# Patient Record
Sex: Male | Born: 1970 | Race: White | Hispanic: No | Marital: Married | Smoking: Former smoker
Health system: Southern US, Community
[De-identification: ages and names within clinical notes are randomized; demographics above are authoritative.]

## PROBLEM LIST (undated history)

## (undated) DIAGNOSIS — S46211A Strain of muscle, fascia and tendon of other parts of biceps, right arm, initial encounter: Secondary | ICD-10-CM

## (undated) DIAGNOSIS — Z973 Presence of spectacles and contact lenses: Secondary | ICD-10-CM

## (undated) HISTORY — PX: APPENDECTOMY: SHX54

---
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
## 2007-10-15 ENCOUNTER — Ambulatory Visit: Payer: Self-pay | Admitting: Gastroenterology

## 2008-10-24 ENCOUNTER — Ambulatory Visit: Payer: Self-pay | Admitting: Internal Medicine

## 2009-08-16 ENCOUNTER — Ambulatory Visit: Payer: Self-pay | Admitting: Internal Medicine

## 2010-03-21 ENCOUNTER — Ambulatory Visit: Payer: Self-pay | Admitting: Internal Medicine

## 2011-09-07 ENCOUNTER — Emergency Department: Payer: Self-pay | Admitting: Emergency Medicine

## 2011-09-08 LAB — URINALYSIS, COMPLETE
Bilirubin,UR: NEGATIVE
Blood: NEGATIVE
Glucose,UR: NEGATIVE mg/dL (ref 0–75)
Ketone: NEGATIVE
Leukocyte Esterase: NEGATIVE
Nitrite: NEGATIVE
RBC,UR: NONE SEEN /HPF (ref 0–5)
Specific Gravity: 1.019 (ref 1.003–1.030)

## 2011-09-08 LAB — COMPREHENSIVE METABOLIC PANEL
Alkaline Phosphatase: 80 U/L (ref 50–136)
Anion Gap: 7 (ref 7–16)
BUN: 15 mg/dL (ref 7–18)
Bilirubin,Total: 0.6 mg/dL (ref 0.2–1.0)
Calcium, Total: 9.1 mg/dL (ref 8.5–10.1)
Chloride: 108 mmol/L — ABNORMAL HIGH (ref 98–107)
Co2: 26 mmol/L (ref 21–32)
EGFR (African American): >60
EGFR (Non-African Amer.): >60
Glucose: 92 mg/dL (ref 65–99)
Potassium: 3.9 mmol/L (ref 3.5–5.1)
Sodium: 141 mmol/L (ref 136–145)

## 2011-09-08 LAB — TSH: Thyroid Stimulating Horm: 2.6 u[IU]/mL

## 2011-09-08 LAB — CBC
HCT: 44.6 % (ref 40.0–52.0)
HGB: 15 g/dL (ref 13.0–18.0)
MCHC: 33.7 g/dL (ref 32.0–36.0)
MCV: 90 fL (ref 80–100)
Platelet: 224 10*3/uL (ref 150–440)
RDW: 13 % (ref 11.5–14.5)
WBC: 15.7 10*3/uL — ABNORMAL HIGH (ref 3.8–10.6)

## 2011-09-08 LAB — MAGNESIUM: Magnesium: 2.4 mg/dL

## 2011-09-08 LAB — CK TOTAL AND CKMB (NOT AT ARMC): CK, Total: 147 U/L (ref 35–232)

## 2011-09-08 LAB — TROPONIN I: Troponin-I: <0.02 ng/mL

## 2011-09-19 ENCOUNTER — Ambulatory Visit: Payer: Self-pay | Admitting: Cardiology

## 2013-08-23 ENCOUNTER — Emergency Department (HOSPITAL_COMMUNITY)
Admission: EM | Admit: 2013-08-23 | Discharge: 2013-08-23 | Disposition: A | Discharge status: Home / Self Care | Payer: BC Managed Care – PPO | Attending: Emergency Medicine | Admitting: Emergency Medicine

## 2013-08-23 ENCOUNTER — Encounter (HOSPITAL_COMMUNITY): Payer: Self-pay | Admitting: Emergency Medicine

## 2013-08-23 DIAGNOSIS — Z791 Long term (current) use of non-steroidal anti-inflammatories (NSAID): Secondary | ICD-10-CM | POA: Insufficient documentation

## 2013-08-23 DIAGNOSIS — Y9239 Other specified sports and athletic area as the place of occurrence of the external cause: Secondary | ICD-10-CM | POA: Insufficient documentation

## 2013-08-23 DIAGNOSIS — X500XXA Overexertion from strenuous movement or load, initial encounter: Secondary | ICD-10-CM | POA: Insufficient documentation

## 2013-08-23 DIAGNOSIS — S46909A Unspecified injury of unspecified muscle, fascia and tendon at shoulder and upper arm level, unspecified arm, initial encounter: Secondary | ICD-10-CM | POA: Insufficient documentation

## 2013-08-23 DIAGNOSIS — Y92838 Other recreation area as the place of occurrence of the external cause: Secondary | ICD-10-CM

## 2013-08-23 DIAGNOSIS — S4980XA Other specified injuries of shoulder and upper arm, unspecified arm, initial encounter: Secondary | ICD-10-CM | POA: Insufficient documentation

## 2013-08-23 DIAGNOSIS — Y9364 Activity, baseball: Secondary | ICD-10-CM | POA: Insufficient documentation

## 2013-08-23 DIAGNOSIS — M67921 Unspecified disorder of synovium and tendon, right upper arm: Secondary | ICD-10-CM

## 2013-08-23 MED ORDER — OXYCODONE-ACETAMINOPHEN 5-325 MG PO TABS: 1.0000 | ORAL_TABLET | Freq: Four times a day (QID) | ORAL | Status: DC | PRN

## 2013-08-23 MED ORDER — IBUPROFEN 800 MG PO TABS: 800.0000 mg | ORAL_TABLET | Freq: Three times a day (TID) | ORAL | Status: DC

## 2013-08-23 MED ORDER — OXYCODONE-ACETAMINOPHEN 5-325 MG PO TABS
2.0000 | ORAL_TABLET | Freq: Once | ORAL | Status: AC
  Administered 2013-08-23: 2 via ORAL
  Filled 2013-08-23: qty 2

## 2013-08-23 NOTE — ED Notes (Signed)
Dr Zavitz at bedside  

## 2013-08-23 NOTE — ED Notes (Signed)
Pt reports that today he was playing baseball, and was batting, was swinging through and felt his right bicep pop. Pain to right bicep, tightness felt. Can move arm, sensation intact. Pt is a x 4

## 2013-08-23 NOTE — ED Notes (Signed)
Paged Ortho Tech to apply sling immobilizer

## 2013-08-23 NOTE — Progress Notes (Signed)
Orthopedic Tech Progress Note Patient Details:  Joshua DrownStephen Brock 1970/05/21 [id number]  Ortho Devices Type of Ortho Device: Shoulder immobilizer Ortho Device/Splint Location: RUE Ortho Device/Splint Interventions: Ordered;Application   Joshua Brock, Joshua Brock 08/23/2013, 6:41 PM

## 2013-08-23 NOTE — Discharge Instructions (Signed)
Please follow up with Dr. Dion SaucierLandau to schedule a follow up appointment. Please take pain medication and/or muscle relaxants as prescribed and as needed for pain. Please do not drive on narcotic pain medication or on muscle relaxants. Please follow RICE method below. Please take motrin for mild to moderate pain and swelling. Please read all discharge instructions and return precautions.   Biceps Tendon Disruption (Distal) with Rehab The biceps tendon attaches the biceps muscle to the bones of the elbow and the shoulder. A distal biceps tendon disruption is a tear of this tendon at the end the attached near the elbow. A distal biceps tendon rupture is an uncommon injury. These injuries usually involve a complete tear of the tendon from the bone; however, partial tears are also possible. The bicep muscle works with other muscles to bend the elbow and rotate the palm upward (supinate). A complete biceps rupture will result in approximately a 30% decrease in elbow bending strength and a 40% decrease in one's ability to supinate the wrist. SYMPTOMS   Pain, tenderness, swelling, warmth, or redness at the elbow, usually in the front of the elbow.  Pain that worsens with flexion of the elbow against resistance and when straightening the elbow.  Bulge can be seen and felt in the arm.  Bruising (contusion) in the elbow or forearm after 24 hours.  Limited motion of the elbow.  Weakness with attempted elbow bending (lifting or carrying) or rotation of the wrist (like when using a screwdriver).  A crackling sound (crepitation) when the tendon or elbow is moved or touched. CAUSES  A biceps tendon rupture occurs when the tendon is subjected to a force that is greater than it can withstand, such as straightening the elbow while the biceps is contracted or direct trauma (rare). RISK INCREASES WITH:   Sports that involve contact, as well as throwing sports, gymnastics, weightlifting, and bodybuilding.  Heavy  labor.  Poor strength and flexibility.  Failure to warm-up properly before activity. PREVENTION  Warm up and stretch appropriately before activity.  Maintain physical fitness:  Strength, flexibility, and endurance.  Cardiovascular fitness  Allow your body to recover between practices and competition.  Learn and use proper technique. PROGNOSIS  Surgery is usually required to fix distal biceps tendon rupture. After surgery, a recovery period of 4 to 8 months can be expected to allow for healing and a return to sports.  RELATED COMPLICATIONS  Weakness of elbow bending and forearm rotation, especially if treated non-surgically.  Prolonged disability.  Re-rupture of the tendon after surgery.  Risks of surgery, including infection, bleeding, injury to nerves, elbow or wrist stiffness or loss of motion, and weakness of elbow bending or wrist rotation. TREATMENT  Treatment initially consists of ice and medication to help reduce pain and inflammation. A sling may also be worn to increase one's comfort. Surgery is required for a full recovery and return to sports. Surgery involves reattaching the tendon to the bone. Weakness can be expected if surgery is not performed; however, this may be acceptable for sedentary individuals. Surgery is usually followed by immobilization and rehabilitation exercises to regain strength and a full range of motion.  MEDICATION  If pain medication is necessary, nonsteroidal anti-inflammatory medications, such as aspirin and ibuprofen, or other minor pain relievers, such as acetaminophen, are often recommended.  Do not take pain medication for 7 days before surgery.  Prescription pain relievers may be given if deemed necessary by your caregiver. Use only as directed and only as much  as you need. HEAT AND COLD  Cold treatment (icing) relieves pain and reduces inflammation. Cold treatment should be applied for 10 to 15 minutes every 2 to 3 hours for  inflammation and pain and immediately after any activity that aggravates your symptoms. Use ice packs or an ice massage.  Heat treatment may be used prior to performing the stretching and strengthening activities prescribed by your caregiver, physical therapist, or athletic trainer. Use a heat pack or a warm soak. SEEK MEDICAL CARE IF:   Symptoms get worse or do not improve in 2 weeks despite treatment.  You experience pain, numbness, or coldness in the hand.  Blue, gray, or dark color appears in the fingernails.  Any of the following occur after surgery:  Increased pain, swelling, redness, drainage, or bleeding in the surgical area.  Signs of infection (headache, muscle aches, dizziness, or a general ill feeling with fever).  New, unexplained symptoms develop (drugs used in treatment may produce side effects).  RICE: Routine Care for Injuries The routine care of many injuries includes Rest, Ice, Compression, and Elevation (RICE). HOME CARE INSTRUCTIONS  Rest is needed to allow your body to heal. Routine activities can usually be resumed when comfortable. Injured tendons and bones can take up to 6 weeks to heal. Tendons are the cord-like structures that attach muscle to bone.  Ice following an injury helps keep the swelling down and reduces pain.  Put ice in a plastic bag.  Place a towel between your skin and the bag.  Leave the ice on for 15-20 minutes, 03-04 times a day. Do this while awake, for the first 24 to 48 hours. After that, continue as directed by your caregiver.  Compression helps keep swelling down. It also gives support and helps with discomfort. If an elastic bandage has been applied, it should be removed and reapplied every 3 to 4 hours. It should not be applied tightly, but firmly enough to keep swelling down. Watch fingers or toes for swelling, bluish discoloration, coldness, numbness, or excessive pain. If any of these problems occur, remove the bandage and reapply  loosely. Contact your caregiver if these problems continue.  Elevation helps reduce swelling and decreases pain. With extremities, such as the arms, hands, legs, and feet, the injured area should be placed near or above the level of the heart, if possible. SEEK IMMEDIATE MEDICAL CARE IF:  You have persistent pain and swelling.  You develop redness, numbness, or unexpected weakness.  Your symptoms are getting worse rather than improving after several days. These symptoms may indicate that further evaluation or further X-rays are needed. Sometimes, X-rays may not show a small broken bone (fracture) until 1 week or 10 days later. Make a follow-up appointment with your caregiver. Ask when your X-ray results will be ready. Make sure you get your X-ray results. Document Released: 06/11/2000 Document Revised: 05/22/2011 Document Reviewed: 07/29/2010 [facility] Patient Information 2014 [place], [place]. Sling Use After Injury or Surgery You have been put in a sling today because of an injury or following surgery. If you have a tendon or bone injury it may take up to 6 weeks to heal. Use the sling as directed until your caregiver says it is no longer needed. The sling protects and keeps you from using the injured part. Hanging your arm in a sling will give rest and support to the injured part. This also helps with comfort and healing. Slings are used for injuries made worse or more painful by movement. Examples include:  Broken arms.  Broken collarbones.  Shoulder injuries.  Following surgery. The sling should fit comfortably, with your elbow at one end of the sling and your hand at the other end. Your elbow is bent 90 degrees lying across your waist and rests in the sling with your thumb pointing up. Make sure that the hand of the injured arm does not droop down. That could stretch some nerves in the wrist. Your hand should be slightly higher than your elbow. You may also pad the sling behind your neck  with some cloth or foam rubber.  A swathe may also be used if it is necessary to keep you from lifting your injured arm. A swathe is a wrap or ace bandage that goes around your chest over your injured arm.  To take the weight off your neck, some slings have a strap that goes around your neck and down your back. One strap is connected to the closed elbow side of the sling with the other end of the strap attached to the wrist side. With a sling like this, your injured shoulder, arm, wrist, or hand is in the sling, the weight is more on your shoulder and back. This is different from the illustration where the sling is supported only by the neck.  In an emergency, a sling can be as simple as a belt or towel tied around your neck to hold your forearm.  HOME CARE INSTRUCTIONS   Do not use your shoulder until instructed to by your caregiver.  If you have been prescribed physical therapy, keep appointments as directed.  For the first couple days following your injury and during times when you are sore, you may use ice on the injured area for 15-20 minutes 03-04 times per day while awake. Put the ice in a plastic bag and place a towel between the bag of ice and your skin. This will help keep the swelling down.  If there is numbness in the fifth finger and ring fingers you may need to pad the elbow to relieve pressure on the ulnar nerve (the crazy bone).  Keep your arm on your chest when lying down.  If a plaster splint was applied, wear the splint until you are seen for a follow-up examination. Rest it on nothing harder than a pillow the first 24 hours. Do not get it wet. You may take it off to take a shower or bath unless instructed otherwise by your caregiver.  You may have been given an elastic bandage to use with the plaster splint or alone. The splint is too tight if you have numbness, tingling, or if your hand becomes cold and blue. Adjust or reapply the bandage to make it comfortable.  Only take  over-the-counter or prescription medicines for pain, discomfort, or fever as directed by your caregiver.  If range of motion exercises are permitted by your caregiver, do not go over the limits suggested. If you have increased pain from doing gentle exercises, stop the exercises until you see your caregiver again.  The length of time needed for healing depends on what your injury or surgery was. SEEK IMMEDIATE MEDICAL CARE IF:   You have an increase in bruising, swelling or pain in the area of your injury or surgery.  You notice a blue color of or coldness in your fingers.  Pain relief is not obtained with medications or any of your problems are getting worse. Document Released: 10/12/2003 Document Revised: 02/14/2012 Document Reviewed: 01/12/2007 ExitCare Patient Information  2014 [employer]. ° °

## 2013-08-23 NOTE — ED Provider Notes (Signed)
CSN: [id number]     Arrival date & time 08/23/13  1640 History   First MD Initiated Contact with Patient 08/23/13 1651     Chief Complaint  Patient presents with  . Arm Injury     (Consider location/radiation/quality/duration/timing/severity/associated sxs/prior Treatment) HPI Comments: Patient is a 43 yo M presenting to the ED for right arm injury. Patient states he was swinging a factor at this point in time he felt a loud popping noise and had pain in the distal bicep area. States it feels very tight, he endorses severe pain. He states his pain is worsened by extension of his elbow and rotation and elbow. Did not take any medications prior to arrival. He denies any other injuries. He denies any numbness or tingling. He is left-hand dominant.  Patient is a 43 y.o. male presenting with arm injury.  Arm Injury Associated symptoms: no fever     History reviewed. No pertinent past medical history. Past Surgical History  Procedure Laterality Date  . Appendectomy     No family history on file. History  Substance Use Topics  . Smoking status: Never Smoker   . Smokeless tobacco: Not on file  . Alcohol Use: No    Review of Systems  Constitutional: Negative for fever and chills.  Musculoskeletal: Positive for arthralgias and myalgias.  All other systems reviewed and are negative.     Allergies  Review of patient's allergies indicates no known allergies.  Home Medications   Prior to Admission medications   Medication Sig Start Date End Date Taking? Authorizing Provider  ibuprofen (ADVIL,MOTRIN) 800 MG tablet Take 1 tablet (800 mg total) by mouth 3 (three) times daily. 08/23/13   Margene Cherian L Radiah Lubinski, PA-C  oxyCODONE-acetaminophen (PERCOCET/ROXICET) 5-325 MG per tablet Take 1-2 tablets by mouth every 6 (six) hours as needed for severe pain. 08/23/13   Lovell Nuttall L Lastacia Solum, PA-C   BP 137/85  Pulse 100  Temp(Src) 99.4 F (37.4 C) (Oral)  Resp 23  Wt 240 lb (108.863 kg)   SpO2 96% Physical Exam  Nursing note and vitals reviewed. Constitutional: He is oriented to person, place, and time. He appears well-developed and well-nourished. He is uncooperative. No distress.  HENT:  Head: Normocephalic and atraumatic.  Right Ear: External ear normal.  Left Ear: External ear normal.  Nose: Nose normal.  Mouth/Throat: Oropharynx is clear and moist.  Eyes: Conjunctivae are normal.  Neck: Normal range of motion. Neck supple.  Cardiovascular: Normal rate, regular rhythm, normal heart sounds and intact distal pulses.   Pulmonary/Chest: Effort normal and breath sounds normal. No respiratory distress.  Abdominal: Soft.  Musculoskeletal:       Right shoulder: He exhibits decreased range of motion, tenderness and decreased strength. He exhibits no bony tenderness, no swelling, no effusion, no crepitus, no deformity, no laceration, no pain, no spasm and normal pulse.       Left shoulder: Normal.       Right elbow: He exhibits decreased range of motion and swelling. He exhibits no deformity and no laceration. Tenderness found.       Left elbow: Normal.       Right wrist: Normal.       Left wrist: Normal.       Arms:      Right hand: Normal.  Neurological: He is alert and oriented to person, place, and time.  Skin: Skin is warm and dry. He is not diaphoretic.  Psychiatric: He has a normal mood and affect.  ED Course  Procedures (including critical care time) Medications  oxyCODONE-acetaminophen (PERCOCET/ROXICET) 5-325 MG per tablet 2 tablet (2 tablets Oral Given 08/23/13 1719)    Labs Review Labs Reviewed - No data to display  Imaging Review No results found.   EKG Interpretation None      MDM   Final diagnoses:  Disorder of tendon of right biceps    Filed Vitals:   08/23/13 1730  BP: 137/85  Pulse: 100  Temp:   Resp: 23   Afebrile, NAD, non-toxic appearing, AAOx4.  Neurovascularly intact. Normal sensation. ROM is tact. Symptoms like related to  biceps tendon rupture. Will place arm in sling and have patient follow up with orthopedics. Return precautions discussed. Patient is agreeable to plan. Patient is stable at time of discharge Patient d/w with Dr. Jodi MourningZavitz, agrees with plan.        Jeannetta EllisJennifer L Cerria Randhawa, PA-C 08/24/13 1308

## 2013-08-25 ENCOUNTER — Other Ambulatory Visit: Payer: Self-pay | Admitting: Orthopedic Surgery

## 2013-08-25 NOTE — ED Provider Notes (Signed)
Medical screening examination/treatment/procedure(s) were conducted as a shared visit with non-physician practitioner(s) or resident and myself. I personally evaluated the patient during the encounter and agree with the findings and plan unless otherwise indicated.  I have personally reviewed any xrays and/ or EKG's with the provider and I agree with interpretation.  Patient presents with left arm swelling and pain after playing baseball and felt a sudden pop during his swing. No history of similar. Pain with flexion supination pronation. Mild swelling. On exam neurovascular intact, no bone tenderness in upper or lower aspect of right arm, mild bulge and right bicep asymmetric to left and tenderness in a.c. joint. Clinically bicep tear versus rupture versus other musculoskeletal strain. Discussed risks and benefits of x-ray and low yield and patient wishes to hold on x-ray and followup with local orthopedic that would recommend. Pain medicines given  Bicep tendon rupture, arm pain   Enid SkeensJoshua M Shyla Gayheart, MD 08/25/13 [phone]

## 2013-08-26 ENCOUNTER — Encounter (HOSPITAL_BASED_OUTPATIENT_CLINIC_OR_DEPARTMENT_OTHER): Payer: Self-pay | Admitting: *Deleted

## 2013-08-26 NOTE — Progress Notes (Signed)
No labs needed

## 2013-08-28 ENCOUNTER — Encounter (HOSPITAL_BASED_OUTPATIENT_CLINIC_OR_DEPARTMENT_OTHER)
Admission: RE | Disposition: A | Discharge status: Home / Self Care | Payer: Self-pay | Source: Ambulatory Visit | Attending: Orthopedic Surgery

## 2013-08-28 ENCOUNTER — Ambulatory Visit (HOSPITAL_BASED_OUTPATIENT_CLINIC_OR_DEPARTMENT_OTHER): Payer: BC Managed Care – PPO | Admitting: Anesthesiology

## 2013-08-28 ENCOUNTER — Ambulatory Visit (HOSPITAL_BASED_OUTPATIENT_CLINIC_OR_DEPARTMENT_OTHER)
Admission: RE | Admit: 2013-08-28 | Discharge: 2013-08-28 | Disposition: A | Discharge status: Home / Self Care | Payer: BC Managed Care – PPO | Source: Ambulatory Visit | Attending: Orthopedic Surgery | Admitting: Orthopedic Surgery

## 2013-08-28 ENCOUNTER — Encounter (HOSPITAL_BASED_OUTPATIENT_CLINIC_OR_DEPARTMENT_OTHER): Payer: Self-pay | Admitting: *Deleted

## 2013-08-28 ENCOUNTER — Encounter (HOSPITAL_BASED_OUTPATIENT_CLINIC_OR_DEPARTMENT_OTHER): Payer: BC Managed Care – PPO | Admitting: Anesthesiology

## 2013-08-28 DIAGNOSIS — S46211A Strain of muscle, fascia and tendon of other parts of biceps, right arm, initial encounter: Secondary | ICD-10-CM

## 2013-08-28 DIAGNOSIS — S43499A Other sprain of unspecified shoulder joint, initial encounter: Secondary | ICD-10-CM | POA: Insufficient documentation

## 2013-08-28 DIAGNOSIS — Y9364 Activity, baseball: Secondary | ICD-10-CM | POA: Insufficient documentation

## 2013-08-28 DIAGNOSIS — S46819A Strain of other muscles, fascia and tendons at shoulder and upper arm level, unspecified arm, initial encounter: Principal | ICD-10-CM

## 2013-08-28 DIAGNOSIS — Z87891 Personal history of nicotine dependence: Secondary | ICD-10-CM | POA: Insufficient documentation

## 2013-08-28 DIAGNOSIS — X58XXXA Exposure to other specified factors, initial encounter: Secondary | ICD-10-CM | POA: Insufficient documentation

## 2013-08-28 HISTORY — PX: DISTAL BICEPS TENDON REPAIR: SHX1461

## 2013-08-28 HISTORY — DX: Strain of muscle, fascia and tendon of other parts of biceps, right arm, initial encounter: S46.211A

## 2013-08-28 HISTORY — DX: Presence of spectacles and contact lenses: Z97.3

## 2013-08-28 SURGERY — REPAIR, TENDON, BICEPS, DISTAL
Anesthesia: Regional | Site: Elbow | Laterality: Right

## 2013-08-28 MED ORDER — BUPIVACAINE-EPINEPHRINE (PF) 0.5% -1:200000 IJ SOLN
INTRAMUSCULAR | Status: DC | PRN
  Administered 2013-08-28: 30 mL via PERINEURAL

## 2013-08-28 MED ORDER — PROPOFOL 10 MG/ML IV BOLUS
INTRAVENOUS | Status: DC | PRN
  Administered 2013-08-28: 200 mg via INTRAVENOUS

## 2013-08-28 MED ORDER — LIDOCAINE HCL (CARDIAC) 20 MG/ML IV SOLN
INTRAVENOUS | Status: DC | PRN
  Administered 2013-08-28: 100 mg via INTRAVENOUS

## 2013-08-28 MED ORDER — MIDAZOLAM HCL 2 MG/2ML IJ SOLN
INTRAMUSCULAR | Status: AC
  Filled 2013-08-28: qty 2

## 2013-08-28 MED ORDER — FENTANYL CITRATE 0.05 MG/ML IJ SOLN
INTRAMUSCULAR | Status: AC
  Filled 2013-08-28: qty 2

## 2013-08-28 MED ORDER — OXYCODONE-ACETAMINOPHEN 10-325 MG PO TABS: 1.0000 | ORAL_TABLET | Freq: Four times a day (QID) | ORAL | Status: DC | PRN

## 2013-08-28 MED ORDER — METHOCARBAMOL 500 MG PO TABS: 500.0000 mg | ORAL_TABLET | Freq: Four times a day (QID) | ORAL | Status: DC

## 2013-08-28 MED ORDER — DEXAMETHASONE SODIUM PHOSPHATE 4 MG/ML IJ SOLN
INTRAMUSCULAR | Status: DC | PRN
  Administered 2013-08-28: 10 mg via INTRAVENOUS

## 2013-08-28 MED ORDER — CEFAZOLIN SODIUM-DEXTROSE 2-3 GM-% IV SOLR
2.0000 g | INTRAVENOUS | Status: AC
  Administered 2013-08-28: 2 g via INTRAVENOUS

## 2013-08-28 MED ORDER — FENTANYL CITRATE 0.05 MG/ML IJ SOLN
50.0000 ug | INTRAMUSCULAR | Status: DC | PRN
  Administered 2013-08-28: 100 ug via INTRAVENOUS

## 2013-08-28 MED ORDER — SENNA-DOCUSATE SODIUM 8.6-50 MG PO TABS: 2.0000 | ORAL_TABLET | Freq: Every day | ORAL | Status: DC

## 2013-08-28 MED ORDER — LACTATED RINGERS IV SOLN
INTRAVENOUS | Status: DC
  Administered 2013-08-28: 10:00:00 via INTRAVENOUS

## 2013-08-28 MED ORDER — MIDAZOLAM HCL 2 MG/2ML IJ SOLN
1.0000 mg | INTRAMUSCULAR | Status: DC | PRN
  Administered 2013-08-28: 2 mg via INTRAVENOUS

## 2013-08-28 MED ORDER — CEFAZOLIN SODIUM-DEXTROSE 2-3 GM-% IV SOLR
INTRAVENOUS | Status: AC
  Filled 2013-08-28: qty 50

## 2013-08-28 MED ORDER — ONDANSETRON HCL 4 MG/2ML IJ SOLN
INTRAMUSCULAR | Status: DC | PRN
  Administered 2013-08-28: 4 mg via INTRAVENOUS

## 2013-08-28 MED ORDER — PROMETHAZINE HCL 25 MG PO TABS: 25.0000 mg | ORAL_TABLET | Freq: Four times a day (QID) | ORAL | Status: DC | PRN

## 2013-08-28 MED ORDER — FENTANYL CITRATE 0.05 MG/ML IJ SOLN
INTRAMUSCULAR | Status: AC
  Filled 2013-08-28: qty 4

## 2013-08-28 SURGICAL SUPPLY — 67 items
BANDAGE ELASTIC 4 VELCRO ST LF (GAUZE/BANDAGES/DRESSINGS) ×4 IMPLANT
BENZOIN TINCTURE PRP APPL 2/3 (GAUZE/BANDAGES/DRESSINGS) ×2 IMPLANT
BLADE SURG 15 STRL LF DISP TIS (BLADE) ×1 IMPLANT
BLADE SURG 15 STRL SS (BLADE) ×1
BNDG ESMARK 4X9 LF (GAUZE/BANDAGES/DRESSINGS) ×2 IMPLANT
CANISTER SUCT 1200ML W/VALVE (MISCELLANEOUS) ×2 IMPLANT
CORDS BIPOLAR (ELECTRODE) IMPLANT
COVER TABLE BACK 60X90 (DRAPES) ×2 IMPLANT
CUFF TOURNIQUET SINGLE 18IN (TOURNIQUET CUFF) IMPLANT
DECANTER SPIKE VIAL GLASS SM (MISCELLANEOUS) IMPLANT
DRAPE EXTREMITY T 121X128X90 (DRAPE) ×2 IMPLANT
DRAPE OEC MINIVIEW 54X84 (DRAPES) ×2 IMPLANT
DRAPE U 20/CS (DRAPES) ×2 IMPLANT
DRAPE U-SHAPE 47X51 STRL (DRAPES) ×2 IMPLANT
DURAPREP 26ML APPLICATOR (WOUND CARE) ×2 IMPLANT
ELECT NEEDLE TIP 2.8 STRL (NEEDLE) IMPLANT
ELECT REM PT RETURN 9FT ADLT (ELECTROSURGICAL) ×2
ELECTRODE REM PT RTRN 9FT ADLT (ELECTROSURGICAL) ×1 IMPLANT
GAUZE SPONGE 4X4 12PLY STRL (GAUZE/BANDAGES/DRESSINGS) ×2 IMPLANT
GAUZE XEROFORM 1X8 LF (GAUZE/BANDAGES/DRESSINGS) ×2 IMPLANT
GLOVE BIO SURGEON STRL SZ 6.5 (GLOVE) ×4 IMPLANT
GLOVE BIO SURGEON STRL SZ8 (GLOVE) ×2 IMPLANT
GLOVE BIOGEL PI IND STRL 7.0 (GLOVE) ×2 IMPLANT
GLOVE BIOGEL PI IND STRL 8 (GLOVE) ×2 IMPLANT
GLOVE BIOGEL PI INDICATOR 7.0 (GLOVE) ×2
GLOVE BIOGEL PI INDICATOR 8 (GLOVE) ×2
GLOVE ORTHO TXT STRL SZ7.5 (GLOVE) ×2 IMPLANT
GOWN STRL REUS W/ TWL LRG LVL3 (GOWN DISPOSABLE) ×2 IMPLANT
GOWN STRL REUS W/ TWL XL LVL3 (GOWN DISPOSABLE) ×2 IMPLANT
GOWN STRL REUS W/TWL LRG LVL3 (GOWN DISPOSABLE) ×2
GOWN STRL REUS W/TWL XL LVL3 (GOWN DISPOSABLE) ×2
IMPL TOGGLELOC ELBOW SYSTEM (Orthopedic Implant) ×1 IMPLANT
IMPLANT TOGGLELOC ELBOW SYSTEM (Orthopedic Implant) ×2 IMPLANT
NEEDLE HYPO 25X1 1.5 SAFETY (NEEDLE) IMPLANT
NS IRRIG 1000ML POUR BTL (IV SOLUTION) ×2 IMPLANT
PACK BASIN DAY SURGERY FS (CUSTOM PROCEDURE TRAY) ×2 IMPLANT
PAD CAST 4YDX4 CTTN HI CHSV (CAST SUPPLIES) ×2 IMPLANT
PADDING CAST ABS 4INX4YD NS (CAST SUPPLIES) ×1
PADDING CAST ABS COTTON 4X4 ST (CAST SUPPLIES) ×1 IMPLANT
PADDING CAST COTTON 4X4 STRL (CAST SUPPLIES) ×2
PENCIL BUTTON HOLSTER BLD 10FT (ELECTRODE) IMPLANT
SLEEVE SCD COMPRESS KNEE MED (MISCELLANEOUS) ×2 IMPLANT
SLING ARM LRG ADULT FOAM STRAP (SOFTGOODS) ×2 IMPLANT
SLING ARM MED ADULT FOAM STRAP (SOFTGOODS) IMPLANT
SPLINT FAST PLASTER 5X30 (CAST SUPPLIES) ×5
SPLINT PLASTER CAST FAST 5X30 (CAST SUPPLIES) ×5 IMPLANT
SPONGE LAP 4X18 X RAY DECT (DISPOSABLE) IMPLANT
STOCKINETTE 4X48 STRL (DRAPES) ×2 IMPLANT
STRIP CLOSURE SKIN 1/2X4 (GAUZE/BANDAGES/DRESSINGS) ×2 IMPLANT
SUCTION FRAZIER TIP 10 FR DISP (SUCTIONS) ×2 IMPLANT
SUT 2 FIBERLOOP 20 STRT BLUE (SUTURE)
SUT FIBERWIRE #2 38 T-5 BLUE (SUTURE)
SUT MNCRL AB 3-0 PS2 18 (SUTURE) IMPLANT
SUT MNCRL AB 4-0 PS2 18 (SUTURE) ×2 IMPLANT
SUT VIC AB 0 CT1 27 (SUTURE)
SUT VIC AB 0 CT1 27XBRD ANBCTR (SUTURE) IMPLANT
SUT VIC AB 2-0 SH 27 (SUTURE)
SUT VIC AB 2-0 SH 27XBRD (SUTURE) IMPLANT
SUT VICRYL 3-0 CR8 SH (SUTURE) ×2 IMPLANT
SUTURE 2 FIBERLOOP 20 STRT BLU (SUTURE) IMPLANT
SUTURE FIBERWR #2 38 T-5 BLUE (SUTURE) IMPLANT
SYR BULB 3OZ (MISCELLANEOUS) ×2 IMPLANT
SYR CONTROL 10ML LL (SYRINGE) IMPLANT
TOWEL OR 17X24 6PK STRL BLUE (TOWEL DISPOSABLE) ×4 IMPLANT
TOWEL OR NON WOVEN STRL DISP B (DISPOSABLE) ×2 IMPLANT
TUBE CONNECTING 20X1/4 (TUBING) ×2 IMPLANT
UNDERPAD 30X30 INCONTINENT (UNDERPADS AND DIAPERS) ×2 IMPLANT

## 2013-08-28 NOTE — Transfer of Care (Signed)
Immediate Anesthesia Transfer of Care Note  Patient: Joshua DrownStephen Brock  Procedure(s) Performed: Procedure(s): RIGHT ELBOW DISTAL BICEPS TENDON REPAIR (Right)  Patient Location: PACU  Anesthesia Type:GA combined with regional for post-op pain  Level of Consciousness: awake, alert  and oriented  Airway & Oxygen Therapy: Patient Spontanous Breathing and Patient connected to face mask oxygen, changed to nasal cannula for patient tolerance.  Post-op Assessment: Report given to PACU RN, Post -op Vital signs reviewed and stable and Patient moving all extremities  Post vital signs: Reviewed and stable  Complications: No apparent anesthesia complications

## 2013-08-28 NOTE — Progress Notes (Signed)
Assisted Dr. Ossey with right, ultrasound guided, supraclavicular block. Side rails up, monitors on throughout procedure. See vital signs in flow sheet. Tolerated Procedure well. 

## 2013-08-28 NOTE — Anesthesia Postprocedure Evaluation (Signed)
  Anesthesia Post-op Note  Patient: Joshua DrownStephen Jablonski  Procedure(s) Performed: Procedure(s): RIGHT ELBOW DISTAL BICEPS TENDON REPAIR (Right)  Patient Location: PACU  Anesthesia Type:GA combined with regional for post-op pain  Level of Consciousness: awake, alert  and oriented  Airway and Oxygen Therapy: Patient Spontanous Breathing  Post-op Pain: none  Post-op Assessment: Post-op Vital signs reviewed  Post-op Vital Signs: Reviewed  Last Vitals:  Filed Vitals:   08/28/13 1610  BP: 140/85  Pulse:   Temp: 36.9 C  Resp: 95    Complications: No apparent anesthesia complications

## 2013-08-28 NOTE — Op Note (Signed)
08/28/2013  2:30 PM  PATIENT:  Joshua Brock    PRE-OPERATIVE DIAGNOSIS:  RIGHT ELBOW  RUPTURE TENDON - BICEPS  POST-OPERATIVE DIAGNOSIS:  Same  PROCEDURE:  Distal biceps tendon repair  SURGEON:  Teryl LucyJoshua Landau, MD  PHYSICIAN ASSISTANT: Janace LittenBrandon Parry, OPA-C, present and scrubbed throughout the case, critical for completion in a timely fashion, and for retraction, instrumentation, and closure.  2nd ASSISTANT: Barth Kirksoshi Sakamoto, OPA-C  ANESTHESIA:   General  PREOPERATIVE INDICATIONS:  Joshua Brock is a  43 y.o. male with a diagnosis of RIGHT ELBOW  RUPTURE TENDON - BICEPS who elected for surgical management.    The risks benefits and alternatives were discussed with the patient preoperatively including but not limited to the risks of infection, bleeding, nerve injury, cardiopulmonary complications, the need for revision surgery, synostosis, lateral antebrachial cutaneous nerve palsy, posterior interosseous nerve palsy, recurrent rupture, heterotopic ossification, among others, and the patient was willing to proceed.  OPERATIVE IMPLANTS: Biomet toggle lock button x1 with a  #2 FiberWire anchoring the tendon to the button.  OPERATIVE FINDINGS: Distal biceps tendon rupture with retraction.  OPERATIVE PROCEDURE: The patient was brought to the operating room and placed in the supine position. General anesthesia was administered. IV antibiotics were given. The upper extremity was prepped and draped in usual sterile fashion. Time out was performed. The arm was elevated and exsanguinated and the tourniquet was inflated. A transverse skin only incision was made just over the radial tuberosity. Dissection was carried down and the lateral antebrachial cutaneous nerve identified and retracted and protected throughout the case. The fascia was bluntly dissected, and the superficial veins were preserved.   The arm was flexed, and the distal biceps tendon stump was easily delivered into view.  I  debrided the distal stump, to a tapered bullet and, and then used a #2 FiberWire starting proximally, working my way distally, and then looping through the button suture, and then back up. This was tied. I placed a second one posteriorly for additional button fixation.  I then placed deep retractors, with a Bennet on the ulnar side of the radial tuberosity, and [employer] on the radial side and distally. I took care not to use any levering retractors on the radial side to prevent injury to the posterior interosseous nerve. I had excellent exposure of the tuberosity, and I roughened the tuberosity with a Key elevator and removed the remaining stump with a rongeur. I placed a guidepin through a single cortex, in the center position, aiming slightly proximally and ulnarly in order to reduce risk to the posterior interosseous nerve.  C-arm was used to confirm the position, and I sized the tendon to a 7, and then once I was happy with the position of the pin on AP and lateral views I then placed the pin across the second cortex and used the 7 mm reamer through the near cortex only.  I  then went through the far cortex with the 4 mm reamer.  The sutures were passed using the Beath pin, and brought out the posterior skin. During placement of the pin the arm was held in full supination to protect the posterior interosseous nerve.  I marked the tendon at approximately 1 cm, and then delivered the button across the radius. This flipped in the first pass, and I used the C-arm to confirm appropriate position.  I then delivered the tendon down into the radial tunnel, and buried the centimeter of tendon into the bone. Excellent fixation was achieved. I took  final C-arm pictures, removed the retractors, irrigated the wounds copiously, and repaired the subcutaneous tissue with Vicryl followed by Steri-Strips and sterile gauze and a posterior splint. The patient also received a preoperative regional block. The patient was  awakened and returned to the PACU in stable and satisfactory condition. There no complications. Total tourniquet time was approximately 55 minutes.

## 2013-08-28 NOTE — Discharge Instructions (Signed)
Diet: As you were doing prior to hospitalization   Shower:  May shower but keep the wounds dry, use an occlusive plastic wrap, NO SOAKING IN TUB.  If the bandage gets wet, change with a clean dry gauze.  Dressing:  Keep splint in place.  Activity:  Increase activity slowly as tolerated, but follow the weight bearing instructions below.  No lifting or driving for 6 weeks.  Weight Bearing:   Keep splint on, no lifting with right arm..    To prevent constipation: you may use a stool softener such as -  Colace (over the counter) 100 mg by mouth twice a day  Drink plenty of fluids (prune juice may be helpful) and high fiber foods Miralax (over the counter) for constipation as needed.    Itching:  If you experience itching with your medications, try taking only a single pain pill, or even half a pain pill at a time.  You may take up to 10 pain pills per day, and you can also use benadryl over the counter for itching or also to help with sleep.   Precautions:  If you experience chest pain or shortness of breath - call 911 immediately for transfer to the hospital emergency department!!  If you develop a fever greater that 101 F, purulent drainage from wound, increased redness or drainage from wound, or calf pain -- Call the office at [phone]                                                Follow- Up Appointment:  Please call for an appointment to be seen in 2 weeks [place] - [phone]   Regional Anesthesia Blocks  1. Numbness or the inability to move the "blocked" extremity may last from 3-48 hours after placement. The length of time depends on the medication injected and your individual response to the medication. If the numbness is not going away after 48 hours, call your surgeon.  2. The extremity that is blocked will need to be protected until the numbness is gone and the  Strength has returned. Because you cannot feel it, you will need to take extra care to avoid injury. Because  it may be weak, you may have difficulty moving it or using it. You may not know what position it is in without looking at it while the block is in effect.  3. For blocks in the legs and feet, returning to weight bearing and walking needs to be done carefully. You will need to wait until the numbness is entirely gone and the strength has returned. You should be able to move your leg and foot normally before you try and bear weight or walk. You will need someone to be with you when you first try to ensure you do not fall and possibly risk injury.  4. Bruising and tenderness at the needle site are common side effects and will resolve in a few days.  5. Persistent numbness or new problems with movement should be communicated to the surgeon or the [facility] [phone])/ [facility] [phone]).   Post Anesthesia Home Care Instructions  Activity: Get plenty of rest for the remainder of the day. A responsible adult should stay with you for 24 hours following the procedure.  For the next 24 hours, DO NOT: -Drive a car Environmental consultant-Operate machinery -Drink  alcoholic beverages -Take any medication unless instructed by your physician -Make any legal decisions or sign important papers.  Meals: Start with liquid foods such as gelatin or soup. Progress to regular foods as tolerated. Avoid greasy, spicy, heavy foods. If nausea and/or vomiting occur, drink only clear liquids until the nausea and/or vomiting subsides. Call your physician if vomiting continues.  Special Instructions/Symptoms: Your throat may feel dry or sore from the anesthesia or the breathing tube placed in your throat during surgery. If this causes discomfort, gargle with warm salt water. The discomfort should disappear within 24 hours.

## 2013-08-28 NOTE — Anesthesia Preprocedure Evaluation (Signed)
Anesthesia Evaluation  Patient identified by MRN, date of birth, ID band Patient awake    Reviewed: Allergy & Precautions, H&P , NPO status , Patient's Chart, lab work & pertinent test results  Airway Mallampati: I TM Distance: >3 FB Neck ROM: Full    Dental   Pulmonary former smoker,          Cardiovascular     Neuro/Psych    GI/Hepatic   Endo/Other    Renal/GU      Musculoskeletal   Abdominal   Peds  Hematology   Anesthesia Other Findings   Reproductive/Obstetrics                           Anesthesia Physical Anesthesia Plan  ASA: I  Anesthesia Plan: General   Post-op Pain Management:    Induction: Intravenous  Airway Management Planned: LMA  Additional Equipment:   Intra-op Plan:   Post-operative Plan: Extubation in OR  Informed Consent: I have reviewed the patients History and Physical, chart, labs and discussed the procedure including the risks, benefits and alternatives for the proposed anesthesia with the patient or authorized representative who has indicated his/her understanding and acceptance.     Plan Discussed with: CRNA and Surgeon  Anesthesia Plan Comments:         Anesthesia Quick Evaluation

## 2013-08-28 NOTE — H&P (Signed)
PREOPERATIVE H&P  Chief Complaint: RIGHT ELBOW  RUPTURE TENDON - BICEPS  HPI: Joshua Brock is a 43 y.o. male who presents for preoperative history and physical with a diagnosis of RIGHT ELBOW  RUPTURE TENDON - BICEPS. Symptoms are rated as moderate to severe, and have been worsening.  This is significantly impairing activities of daily living.  He has elected for surgical management. This occurred while he was playing baseball, he felt acute searing pain directly over his proximal forearm, with deformity at his biceps tendon.  Past Medical History  Diagnosis Date  . Wears glasses    Past Surgical History  Procedure Laterality Date  . Appendectomy    . Knee arthroscopy w/ meniscectomy  1991    right    History   Social History  . Marital Status: Married    Spouse Name: N/A    Number of Children: N/A  . Years of Education: N/A   Social History Main Topics  . Smoking status: Former Smoker    Quit date: 08/26/1996  . Smokeless tobacco: None  . Alcohol Use: Yes     Comment: rare  . Drug Use: No  . Sexual Activity: None   Other Topics Concern  . None   Social History Narrative  . None   History reviewed. No pertinent family history. No Known Allergies Prior to Admission medications   Medication Sig Start Date End Date Taking? Authorizing Provider  ibuprofen (ADVIL,MOTRIN) 800 MG tablet Take 1 tablet (800 mg total) by mouth 3 (three) times daily. 08/23/13  Yes Jennifer L Piepenbrink, PA-C  oxyCODONE-acetaminophen (PERCOCET/ROXICET) 5-325 MG per tablet Take 1-2 tablets by mouth every 6 (six) hours as needed for severe pain. 08/23/13  Yes Jennifer L Piepenbrink, PA-C     Positive ROS: All other systems have been reviewed and were otherwise negative with the exception of those mentioned in the HPI and as above.  Physical Exam: General: Alert, no acute distress Cardiovascular: No pedal edema Respiratory: No cyanosis, no use of accessory musculature GI: No organomegaly,  abdomen is soft and non-tender Skin: No lesions in the area of chief complaint Neurologic: Sensation intact distally Psychiatric: Patient is competent for consent with normal mood and affect Lymphatic: No axillary or cervical lymphadenopathy  MUSCULOSKELETAL: Right arm has sensation intact throughout, elbow has reversed Popeye sign, weakness supination, positive pain over the antecubital fossa.  Assessment: RIGHT ELBOW  RUPTURE TENDON - BICEPS  Plan: Plan for Procedure(s): RIGHT ELBOW DISTAL BICEPS TENDON REPAIR  The risks benefits and alternatives were discussed with the patient including but not limited to the risks of nonoperative treatment, versus surgical intervention including infection, bleeding, nerve injury,  blood clots, cardiopulmonary complications, morbidity, mortality, among others, and they were willing to proceed. We've also discussed the risk for heterotopic ossification, posterior interosseous nerve palsy, lateral antebrachial cutaneous nerve palsy, recurrent rupture, among others.  Eulas PostLANDAU,JOSHUA P, MD Cell [phone]   08/28/2013 11:30 AM

## 2013-08-28 NOTE — Anesthesia Procedure Notes (Addendum)
Anesthesia Regional Block:  Supraclavicular block  Pre-Anesthetic Checklist: ,, timeout performed, Correct Patient, Correct Site, Correct Laterality, Correct Procedure, Correct Position, site marked, Risks and benefits discussed,  Surgical consent,  Pre-op evaluation,  At surgeon's request and post-op pain management  Laterality: Right  Prep: chloraprep       Needles:  Injection technique: Single-shot  Needle Type: Echogenic Stimulator Needle     Needle Length: 9cm 9 cm Needle Gauge: 21 and 21 G    Additional Needles:  Procedures: ultrasound guided (picture in chart) and nerve stimulator Supraclavicular block  Nerve Stimulator or Paresthesia:  Response: 0.4 mA,   Additional Responses:   Narrative:  Start time: 08/28/2013 12:40 PM End time: 08/28/2013 12:50 PM Injection made incrementally with aspirations every 5 mL.  Performed by: Personally  Anesthesiologist: Arta BruceKevin Ossey MD  Additional Notes: Monitors applied. Patient sedated. Sterile prep and drape,hand hygiene and sterile gloves were used. Relevant anatomy identified.Needle position confirmed.Local anesthetic injected incrementally after negative aspiration. Local anesthetic spread visualized around nerve(s). Vascular puncture avoided. No complications. Image printed for medical record.The patient tolerated the procedure well.        Procedure Name: LMA Insertion Performed by: York GricePEARSON, Leann Mayweather W Pre-anesthesia Checklist: Patient identified, Timeout performed, Emergency Drugs available, Suction available and Patient being monitored Patient Re-evaluated:Patient Re-evaluated prior to inductionOxygen Delivery Method: Circle system utilized Preoxygenation: Pre-oxygenation with 100% oxygen Intubation Type: IV induction Ventilation: Mask ventilation without difficulty LMA: LMA with gastric port inserted LMA Size: 4.0 Tube type: Oral Number of attempts: 1 Placement Confirmation: breath sounds checked- equal and bilateral  and positive ETCO2 Tube secured with: Tape Dental Injury: Teeth and Oropharynx as per pre-operative assessment

## 2013-08-29 ENCOUNTER — Encounter (HOSPITAL_BASED_OUTPATIENT_CLINIC_OR_DEPARTMENT_OTHER): Payer: Self-pay | Admitting: Orthopedic Surgery

## 2013-08-29 NOTE — Addendum Note (Signed)
Addendum created 08/29/13 1113 by Marcial Pacasimothy Blocker   Modules edited: Charges VN

## 2019-10-17 ENCOUNTER — Encounter: Payer: Self-pay | Admitting: Emergency Medicine

## 2019-10-17 ENCOUNTER — Other Ambulatory Visit: Payer: Self-pay

## 2019-10-17 ENCOUNTER — Ambulatory Visit
Admission: EM | Admit: 2019-10-17 | Discharge: 2019-10-17 | Disposition: A | Discharge status: Home / Self Care | Payer: BC Managed Care – PPO | Attending: Family Medicine | Admitting: Family Medicine

## 2019-10-17 ENCOUNTER — Ambulatory Visit (INDEPENDENT_AMBULATORY_CARE_PROVIDER_SITE_OTHER): Payer: BC Managed Care – PPO

## 2019-10-17 DIAGNOSIS — U071 COVID-19: Secondary | ICD-10-CM | POA: Diagnosis not present

## 2019-10-17 DIAGNOSIS — J1282 Pneumonia due to coronavirus disease 2019: Secondary | ICD-10-CM | POA: Diagnosis not present

## 2019-10-17 MED ORDER — ACETAMINOPHEN 500 MG PO TABS
1000.0000 mg | ORAL_TABLET | Freq: Once | ORAL | Status: AC
  Administered 2019-10-17: 1000 mg via ORAL

## 2019-10-17 MED ORDER — PREDNISONE 20 MG PO TABS: 40.0000 mg | ORAL_TABLET | Freq: Every day | ORAL | 0 refills | Status: AC

## 2019-10-17 NOTE — ED Provider Notes (Signed)
[facility]    CSN: [id number] Arrival date & time: 10/17/19  1610      History   Chief Complaint Chief Complaint  Patient presents with   Cough    COVID +   Fever    HPI Joshua Brock is a 49 y.o. male.   Joshua Brock presents with complaints of persistent fever. Symptoms started approximately 7-8 days ago, and then was diagnosed with covid-19. Minimal cough. No shortness of breath. States he feels some tightness sensation with deep inhalation. Has had fatigue. No gi symptoms. No nasal drainage. Has been taking tylenol and ibuprofen every 4 hours which only briefly help with fever. Ibuprofen last 4 hours prior to arrival today. He is concerned with how long he has had fevers.     ROS per HPI, negative if not otherwise mentioned.      Past Medical History:  Diagnosis Date   Rupture of right distal biceps tendon 08/28/2013   Wears glasses     Patient Active Problem List   Diagnosis Date Noted   Rupture of right distal biceps tendon 08/28/2013    Past Surgical History:  Procedure Laterality Date   APPENDECTOMY     DISTAL BICEPS TENDON REPAIR Right 08/28/2013   Procedure: RIGHT ELBOW DISTAL BICEPS TENDON REPAIR;  Surgeon: Eulas Post, MD;  Location: [facility];  Service: Orthopedics;  Laterality: Right;   KNEE ARTHROSCOPY W/ MENISCECTOMY  1991   right        Home Medications    Prior to Admission medications   Medication Sig Start Date End Date Taking? Authorizing Provider  methocarbamol (ROBAXIN) 500 MG tablet Take 1 tablet (500 mg total) by mouth 4 (four) times daily. 08/28/13   Teryl Lucy, MD  oxyCODONE-acetaminophen (PERCOCET) 10-325 MG per tablet Take 1-2 tablets by mouth every 6 (six) hours as needed for pain. MAXIMUM TOTAL ACETAMINOPHEN DOSE IS 4000 MG PER DAY 08/28/13   Teryl Lucy, MD  predniSONE (DELTASONE) 20 MG tablet Take 2 tablets (40 mg total) by mouth daily with breakfast for 5 days. 10/17/19  10/22/19  Georgetta Haber, NP  promethazine (PHENERGAN) 25 MG tablet Take 1 tablet (25 mg total) by mouth every 6 (six) hours as needed for nausea or vomiting. 08/28/13   Teryl Lucy, MD  sennosides-docusate sodium (SENOKOT-S) 8.6-50 MG tablet Take 2 tablets by mouth daily. 08/28/13   Teryl Lucy, MD    Family History Family History  Problem Relation Age of Onset   Healthy Mother    Healthy Father     Social History Social History   Tobacco Use   Smoking status: Former Smoker    Quit date: 08/26/1996    Years since quitting: 23.1   Smokeless tobacco: Never Used  Vaping Use   Vaping Use: Never used  Substance Use Topics   Alcohol use: Yes    Comment: rare   Drug use: No     Allergies   Patient has no known allergies.   Review of Systems Review of Systems   Physical Exam Triage Vital Signs ED Triage Vitals  Enc Vitals Group     BP 10/17/19 1647 109/76     Pulse Rate 10/17/19 1647 93     Resp 10/17/19 1647 16     Temp 10/17/19 1647 (!) 102.5 F (39.2 C)     Temp Source 10/17/19 1647 Oral     SpO2 10/17/19 1647 95 %     Weight 10/17/19 1644 243  lb (110.2 kg)     Height 10/17/19 1644 5\' 11"  (1.803 m)     Head Circumference --      Peak Flow --      Pain Score 10/17/19 1644 0     Pain Loc --      Pain Edu? --      Excl. in GC? --    No data found.  Updated Vital Signs BP 109/76 (BP Location: Right Arm)    Pulse 93    Temp (!) 102.5 F (39.2 C) (Oral)    Resp 16    Ht 5\' 11"  (1.803 m)    Wt 243 lb (110.2 kg)    SpO2 95%    BMI 33.89 kg/m   Visual Acuity Right Eye Distance:   Left Eye Distance:   Bilateral Distance:    Right Eye Near:   Left Eye Near:    Bilateral Near:     Physical Exam Constitutional:      Appearance: He is well-developed.  HENT:     Right Ear: Tympanic membrane is scarred.     Left Ear: Tympanic membrane is scarred.  Cardiovascular:     Rate and Rhythm: Normal rate.  Pulmonary:     Effort: Pulmonary effort is  normal.  Skin:    General: Skin is warm and dry.  Neurological:     Mental Status: He is alert and oriented to person, place, and time.      UC Treatments / Results  Labs (all labs ordered are listed, but only abnormal results are displayed) Labs Reviewed - No data to display  EKG   Radiology DG Chest 2 View  Result Date: 10/17/2019 CLINICAL DATA:  Persistent fever status post COVID-19 pneumonia EXAM: CHEST - 2 VIEW COMPARISON:  Chest x-ray dated September 07, 2011 FINDINGS: There are scattered airspace opacities bilaterally for example in the right mid lung zone and in the left retrocardiac region. There is no pneumothorax. No large pleural effusion. The heart size is normal. There is no acute osseous abnormality. IMPRESSION: Scattered bilateral airspace opacities which may represent areas of parenchymal scarring status post COVID-19 pneumonia. Alternatively, these may represent active infiltrates in the appropriate clinical setting. Electronically Signed   By: 12/17/2019 M.D.   On: 10/17/2019 17:39    Procedures Procedures (including critical care time)  Medications Ordered in UC Medications  acetaminophen (TYLENOL) tablet 1,000 mg (1,000 mg Oral Given 10/17/19 1652)    Initial Impression / Assessment and Plan / UC Course  I have reviewed the triage vital signs and the nursing notes.  Pertinent labs & imaging results that were available during my care of the patient were reviewed by me and considered in my medical decision making (see chart for details).     Xray consistent with covid pneumonia. No work of breathing. No hypoxia or tachycardia. Prednisone provided. Encouraged continued supportive cares. Return precautions provided. Patient verbalized understanding and agreeable to plan.    Final Clinical Impressions(s) / UC Diagnoses   Final diagnoses:  Pneumonia due to COVID-19 virus     Discharge Instructions     Your chest xray is consistent with pneumonia, which  is not surprising with your persistent fever.  Push fluids to ensure adequate hydration and keep secretions thin.  Tylenol and/or ibuprofen as needed for pain or fevers.  You may try some vitamins to help your immune system potentially:  Vitamin C 500mg  twice a day. Zinc 50mg  daily. Vitamin D 5000IU daily.  I have sent for a steroid, prednisone, which may help some with your symptoms.  It may not be unusual to have lingering and prolonged symptoms related to covid.  Please seek additional evaluation if you worsen- increased shortness of breath , difficulty breathing, or otherwise worsening.     ED Prescriptions    Medication Sig Dispense Auth. Provider   predniSONE (DELTASONE) 20 MG tablet Take 2 tablets (40 mg total) by mouth daily with breakfast for 5 days. 10 tablet Georgetta Haber, NP     PDMP not reviewed this encounter.   Georgetta Haber, NP 10/17/19 1845

## 2019-10-17 NOTE — ED Triage Notes (Signed)
Patient c/o ongoing cough and fever since being diagnosed with COVIDon 10/13/19.

## 2019-10-17 NOTE — Discharge Instructions (Signed)
Your chest xray is consistent with pneumonia, which is not surprising with your persistent fever.  Push fluids to ensure adequate hydration and keep secretions thin.  Tylenol and/or ibuprofen as needed for pain or fevers.  You may try some vitamins to help your immune system potentially:  Vitamin C 500mg  twice a day. Zinc 50mg  daily. Vitamin D 5000IU daily.   I have sent for a steroid, prednisone, which may help some with your symptoms.  It may not be unusual to have lingering and prolonged symptoms related to covid.  Please seek additional evaluation if you worsen- increased shortness of breath , difficulty breathing, or otherwise worsening.

## 2019-10-19 ENCOUNTER — Other Ambulatory Visit: Payer: Self-pay

## 2019-10-19 DIAGNOSIS — J1282 Pneumonia due to coronavirus disease 2019: Secondary | ICD-10-CM | POA: Insufficient documentation

## 2019-10-19 DIAGNOSIS — R509 Fever, unspecified: Secondary | ICD-10-CM | POA: Diagnosis present

## 2019-10-19 DIAGNOSIS — Z87891 Personal history of nicotine dependence: Secondary | ICD-10-CM | POA: Insufficient documentation

## 2019-10-19 DIAGNOSIS — R0902 Hypoxemia: Secondary | ICD-10-CM | POA: Diagnosis not present

## 2019-10-19 LAB — CBC WITH DIFFERENTIAL/PLATELET
Abs Immature Granulocytes: 0.03 10*3/uL (ref 0.00–0.07)
Basophils Absolute: 0 10*3/uL (ref 0.0–0.1)
Basophils Relative: 0 %
Eosinophils Absolute: 0 10*3/uL (ref 0.0–0.5)
Eosinophils Relative: 0 %
HCT: 41.7 % (ref 39.0–52.0)
Hemoglobin: 14.1 g/dL (ref 13.0–17.0)
Immature Granulocytes: 1 %
Lymphocytes Relative: 18 %
Lymphs Abs: 1 10*3/uL (ref 0.7–4.0)
MCH: 30.5 pg (ref 26.0–34.0)
MCHC: 33.8 g/dL (ref 30.0–36.0)
MCV: 90.1 fL (ref 80.0–100.0)
Monocytes Absolute: 0.3 10*3/uL (ref 0.1–1.0)
Monocytes Relative: 4 %
Neutro Abs: 4.3 10*3/uL (ref 1.7–7.7)
Neutrophils Relative %: 77 %
Platelets: 142 10*3/uL — ABNORMAL LOW (ref 150–400)
RBC: 4.63 MIL/uL (ref 4.22–5.81)
RDW: 13.5 % (ref 11.5–15.5)
WBC: 5.7 10*3/uL (ref 4.0–10.5)
nRBC: 0 % (ref 0.0–0.2)

## 2019-10-19 LAB — COMPREHENSIVE METABOLIC PANEL
ALT: 88 U/L — ABNORMAL HIGH (ref 0–44)
AST: 85 U/L — ABNORMAL HIGH (ref 15–41)
Albumin: 3.3 g/dL — ABNORMAL LOW (ref 3.5–5.0)
Alkaline Phosphatase: 45 U/L (ref 38–126)
Anion gap: 11 (ref 5–15)
BUN: 13 mg/dL (ref 6–20)
CO2: 26 mmol/L (ref 22–32)
Calcium: 8.3 mg/dL — ABNORMAL LOW (ref 8.9–10.3)
Chloride: 100 mmol/L (ref 98–111)
Creatinine, Ser: 1.03 mg/dL (ref 0.61–1.24)
GFR calc Af Amer: >60 mL/min (ref >60)
GFR calc non Af Amer: >60 mL/min (ref >60)
Glucose, Bld: 116 mg/dL — ABNORMAL HIGH (ref 70–99)
Potassium: 3.9 mmol/L (ref 3.5–5.1)
Sodium: 137 mmol/L (ref 135–145)
Total Bilirubin: 0.6 mg/dL (ref 0.3–1.2)
Total Protein: 6.7 g/dL (ref 6.5–8.1)

## 2019-10-19 LAB — LACTIC ACID, PLASMA: Lactic Acid, Venous: 1 mmol/L (ref 0.5–1.9)

## 2019-10-19 NOTE — ED Triage Notes (Signed)
Patient reports continued fever since being diagnosed with COVID even though alternating tylenol and ibuprofen.

## 2019-10-19 NOTE — ED Notes (Signed)
Pt states he wants to wait in his vehicle. Pt informed that this rn would prefer if he would wait in waiting room for monitoring.

## 2019-10-20 ENCOUNTER — Emergency Department
Admission: EM | Admit: 2019-10-20 | Discharge: 2019-10-20 | Disposition: A | Discharge status: Home / Self Care | Payer: BC Managed Care – PPO | Attending: Emergency Medicine | Admitting: Emergency Medicine

## 2019-10-20 ENCOUNTER — Emergency Department: Payer: BC Managed Care – PPO

## 2019-10-20 DIAGNOSIS — J1282 Pneumonia due to coronavirus disease 2019: Secondary | ICD-10-CM

## 2019-10-20 DIAGNOSIS — R0902 Hypoxemia: Secondary | ICD-10-CM

## 2019-10-20 MED ORDER — IBUPROFEN 600 MG PO TABS
600.0000 mg | ORAL_TABLET | Freq: Once | ORAL | Status: AC
  Administered 2019-10-20: 600 mg via ORAL
  Filled 2019-10-20: qty 1

## 2019-10-20 NOTE — Discharge Instructions (Signed)
Follow-up with your primary care provider by phone if any continued problems.  Return to the emergency department if any severe sudden changes such as shortness of breath or difficulty breathing.  Continue taking your prednisone until completely finished.  Drink fluids to stay hydrated.  Tylenol or ibuprofen for fever.  Try to walk frequently in the house and take deep breaths.

## 2019-10-20 NOTE — ED Notes (Signed)
Pulse ox check = 93% room air.

## 2019-10-20 NOTE — ED Notes (Signed)
Ambulated pt up and down hallway from rooms 40 to 45. Pt's O2 sats dropped to 89% briefly during ambulation. Pt's O2 sats dropped to 88% upon returning to bed and then returned to 93%, pt's HR maxed at 111 bpm during ambulation.

## 2019-10-20 NOTE — ED Notes (Signed)
Pt reports testing positive for Covid last Wednesday. Pt reports having covid like symptoms x 1.5 weeks. Pt states he has had fevers and O2 sats at home have been in the low 90's. Pt 93% on RA in bed.

## 2019-10-20 NOTE — ED Provider Notes (Signed)
Care    [facility] Emergency Department Provider Note   ____________________________________________   First MD Initiated Contact with Patient 10/20/19 [phone]     (approximate)  I have reviewed the triage vital signs and the nursing notes.   HISTORY  Chief Complaint Fever   HPI Joshua Brock is a 49 y.o. male presents to the ED with complaint of inability to control his fever at home although he is alternating Tylenol and ibuprofen he continues to have temperature 101-102.  Patient tested positive for Covid on 10/13/2019 at [facility].  He was seen at [facility] urgent care on 10/17/2019 where he was placed on prednisone 40 mg daily for the next 5 days.  Patient continues to have tightness in his chest, shortness of breath and has had O2 sats in the upper 80s at home.  He also complains of fatigue.  Patient is a former smoker.  He denies any previous history of COPD.  Currently he is having no GI symptoms.        Past Medical History:  Diagnosis Date  . Rupture of right distal biceps tendon 08/28/2013  . Wears glasses     Patient Active Problem List   Diagnosis Date Noted  . Rupture of right distal biceps tendon 08/28/2013    Past Surgical History:  Procedure Laterality Date  . APPENDECTOMY    . DISTAL BICEPS TENDON REPAIR Right 08/28/2013   Procedure: RIGHT ELBOW DISTAL BICEPS TENDON REPAIR;  Surgeon: Eulas Post, MD;  Location: [facility];  Service: Orthopedics;  Laterality: Right;  . KNEE ARTHROSCOPY W/ MENISCECTOMY  1991   right     Prior to Admission medications   Medication Sig Start Date End Date Taking? Authorizing Provider  acetaminophen (TYLENOL) 325 MG tablet Take 650 mg by mouth every 6 (six) hours as needed.   Yes [provider]  ibuprofen (ADVIL) 200 MG tablet Take 200 mg by mouth every 6 (six) hours as needed.   Yes [provider]  predniSONE (DELTASONE) 20 MG tablet Take 2 tablets (40 mg total) by mouth  daily with breakfast for 5 days. 10/17/19 10/22/19 Yes Georgetta Haber, NP    Allergies Patient has no known allergies.  Family History  Problem Relation Age of Onset  . Healthy Mother   . Healthy Father     Social History Social History   Tobacco Use  . Smoking status: Former Smoker    Quit date: 08/26/1996    Years since quitting: 23.1  . Smokeless tobacco: Never Used  Vaping Use  . Vaping Use: Never used  Substance Use Topics  . Alcohol use: Yes    Comment: rare  . Drug use: No    Review of Systems Constitutional: Positive fever/chills Eyes: No visual changes. ENT: No sore throat. Cardiovascular: Denies chest pain. Respiratory: Positive shortness of breath. Gastrointestinal: No abdominal pain.  No nausea, no vomiting.  No diarrhea.   Musculoskeletal: Positive muscle aches. Skin: Negative for rash. Neurological: Negative for headaches, focal weakness or numbness.  ____________________________________________   PHYSICAL EXAM:  VITAL SIGNS: ED Triage Vitals [10/19/19 2051]  Enc Vitals Group     BP 117/64     Pulse Rate 89     Resp 19     Temp 100.2 F (37.9 C)     Temp Source Oral     SpO2 93 %     Weight 242 lb (109.8 kg)     Height 5\' 11"  (1.803 m)  Head Circumference      Peak Flow      Pain Score      Pain Loc      Pain Edu?      Excl. in GC?    Constitutional: Alert and oriented. Well appearing and in no acute distress. Eyes: Conjunctivae are normal.  Head: Atraumatic. Nose: No congestion/rhinnorhea. Neck: No stridor.   Cardiovascular: Normal rate, regular rhythm. Grossly normal heart sounds.  Good peripheral circulation. Respiratory: Normal respiratory effort.  No retractions. Lungs no wheezes or rales noted.  Patient is able to speak in complete sentences without respiratory difficulty.  Resting on room air presently is 93%. Musculoskeletal: Moves upper and lower extremities with any difficulty.  Normal gait was noted. Neurologic:  Normal  speech and language. No gross focal neurologic deficits are appreciated.  Skin:  Skin is warm, dry and intact.  Psychiatric: Mood and affect are normal. Speech and behavior are normal.  ____________________________________________   LABS (all labs ordered are listed, but only abnormal results are displayed)  Labs Reviewed  CBC WITH DIFFERENTIAL/PLATELET - Abnormal; Notable for the following components:      Result Value   Platelets 142 (*)    All other components within normal limits  COMPREHENSIVE METABOLIC PANEL - Abnormal; Notable for the following components:   Glucose, Bld 116 (*)    Calcium 8.3 (*)    Albumin 3.3 (*)    AST 85 (*)    ALT 88 (*)    All other components within normal limits  LACTIC ACID, PLASMA  LACTIC ACID, PLASMA    RADIOLOGY Deferred   Official radiology report(s): DG Chest 2 View  Result Date: 10/20/2019 CLINICAL DATA:  Follow-up pulmonary infiltrates. COVID positive. EXAM: CHEST - 2 VIEW COMPARISON:  10/17/2019 FINDINGS: The cardiac silhouette, mediastinal and hilar contours are within normal limits and stable. Persistent and slightly progressive patchy bilateral lung infiltrates. No pleural effusions. No pneumothorax. The bony thorax is intact. IMPRESSION: Persistent and slightly progressive patchy bilateral lung infiltrates. Electronically Signed   By: Rudie Meyer M.D.   On: 10/20/2019 07:43     ____________________________________________   PROCEDURES  Procedure(s) performed (including Critical Care):  Procedures   ____________________________________________   INITIAL IMPRESSION / ASSESSMENT AND PLAN / ED COURSE  As part of my medical decision making, I reviewed the following data within the electronic MEDICAL RECORD NUMBER Notes from prior ED visits and [place] Controlled Substance Database  Joshua Brock was evaluated in Emergency Department on 10/20/2019 for the symptoms described in the history of present illness. He was evaluated in the  context of the global COVID-19 pandemic, which necessitated consideration that the patient might be at risk for infection with the SARS-CoV-2 virus that causes COVID-19. Institutional protocols and algorithms that pertain to the evaluation of patients at risk for COVID-19 are in a state of rapid change based on information released by regulatory bodies including the CDC and federal and state organizations. These policies and algorithms were followed during the patient's care in the ED.  49 year old male presents to the ED with complaint of fever, tightness to chest and shortness of breath.  Patient was diagnosed with Covid approximately 11 days ago and was seen by [place] urgent care on 10/17/2019 and started on prednisone 40 mg daily for the next 5 days.  Patient states he has had difficulty controlling his fever alternating between Tylenol and ibuprofen.  Chest x-ray shows progression of his bilateral pneumonia.  Initial O2 sat was 96%  however in the room prior to ambulation patient's O2 sat was 93% and dropped to 88%.  Patient became tachycardic at 111 during ambulation.  Wife states the patient is very anxious about his breathing.  She also has been diagnosed with Covid but states she is doing much better than her husband.  He continues to have coughing episodes and fatigue.  Dr. Delton Prairie was into talk to the patient and we discussed admission for hypoxia.   Hospitalist was consulted in regards to patient's admission.  Hospitalist was into get history and physical for admission and patient voiced that he wanted his vital signs rechecked and refused admission stating that he had been here for 12 hours.  At the time of discharge patient's temp was 100.1, pulse rate was 97 and O2 sat 93%.  Patient was encouraged to return to the emergency department if any severe worsening of his symptoms and to continue taking his prednisone until completely finished.  ____________________________________________   FINAL  CLINICAL IMPRESSION(S) / ED DIAGNOSES  Final diagnoses:  Hypoxia  Pneumonia due to COVID-19 virus     ED Discharge Orders    None       Note:  This document was prepared using Dragon voice recognition software and may include unintentional dictation errors.    Tommi Rumps, PA-C 10/20/19 1333    Delton Prairie, MD 10/20/19 [phone]

## 2020-10-01 IMAGING — CR DG CHEST 2V
2 series · 2 of 2 positions shown · non-contrast
Comparison: Chest x-ray dated September 07, 2011

CLINICAL DATA: Persistent fever status post WH83Q-WO pneumonia

EXAM:
CHEST - 2 VIEW

[chest pa]
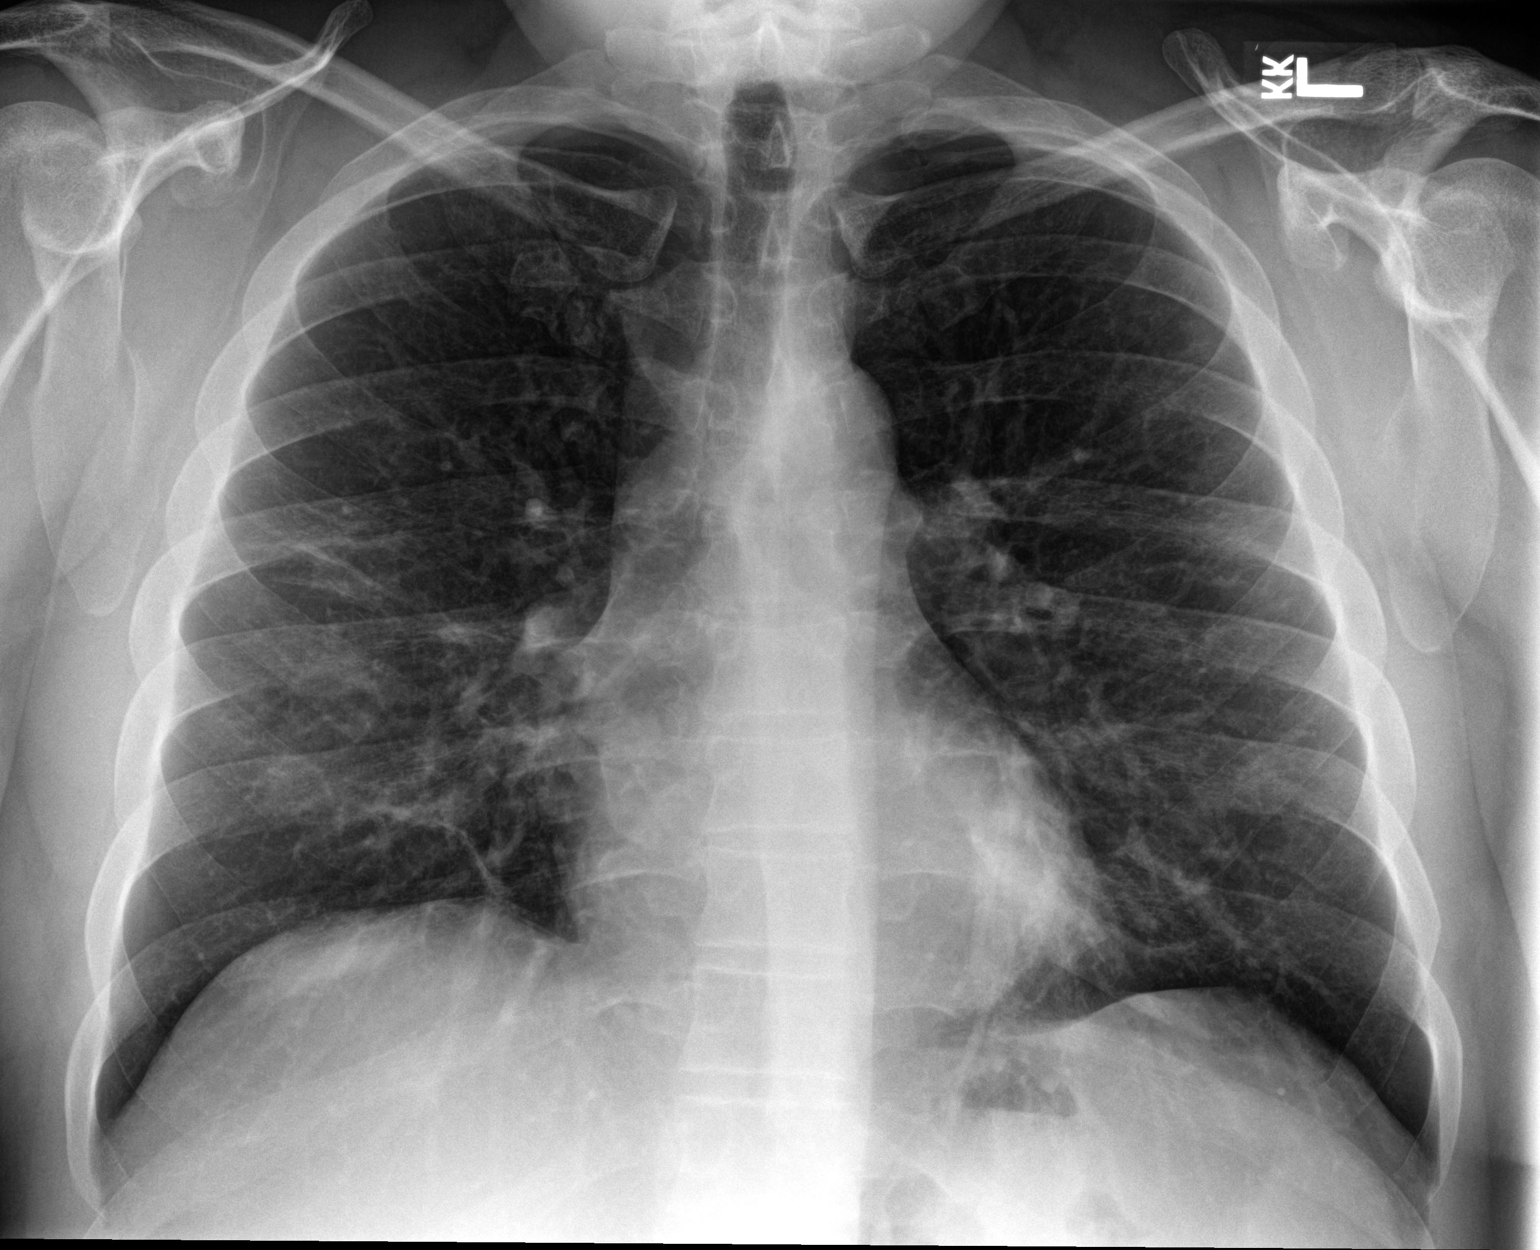

[chest lat]
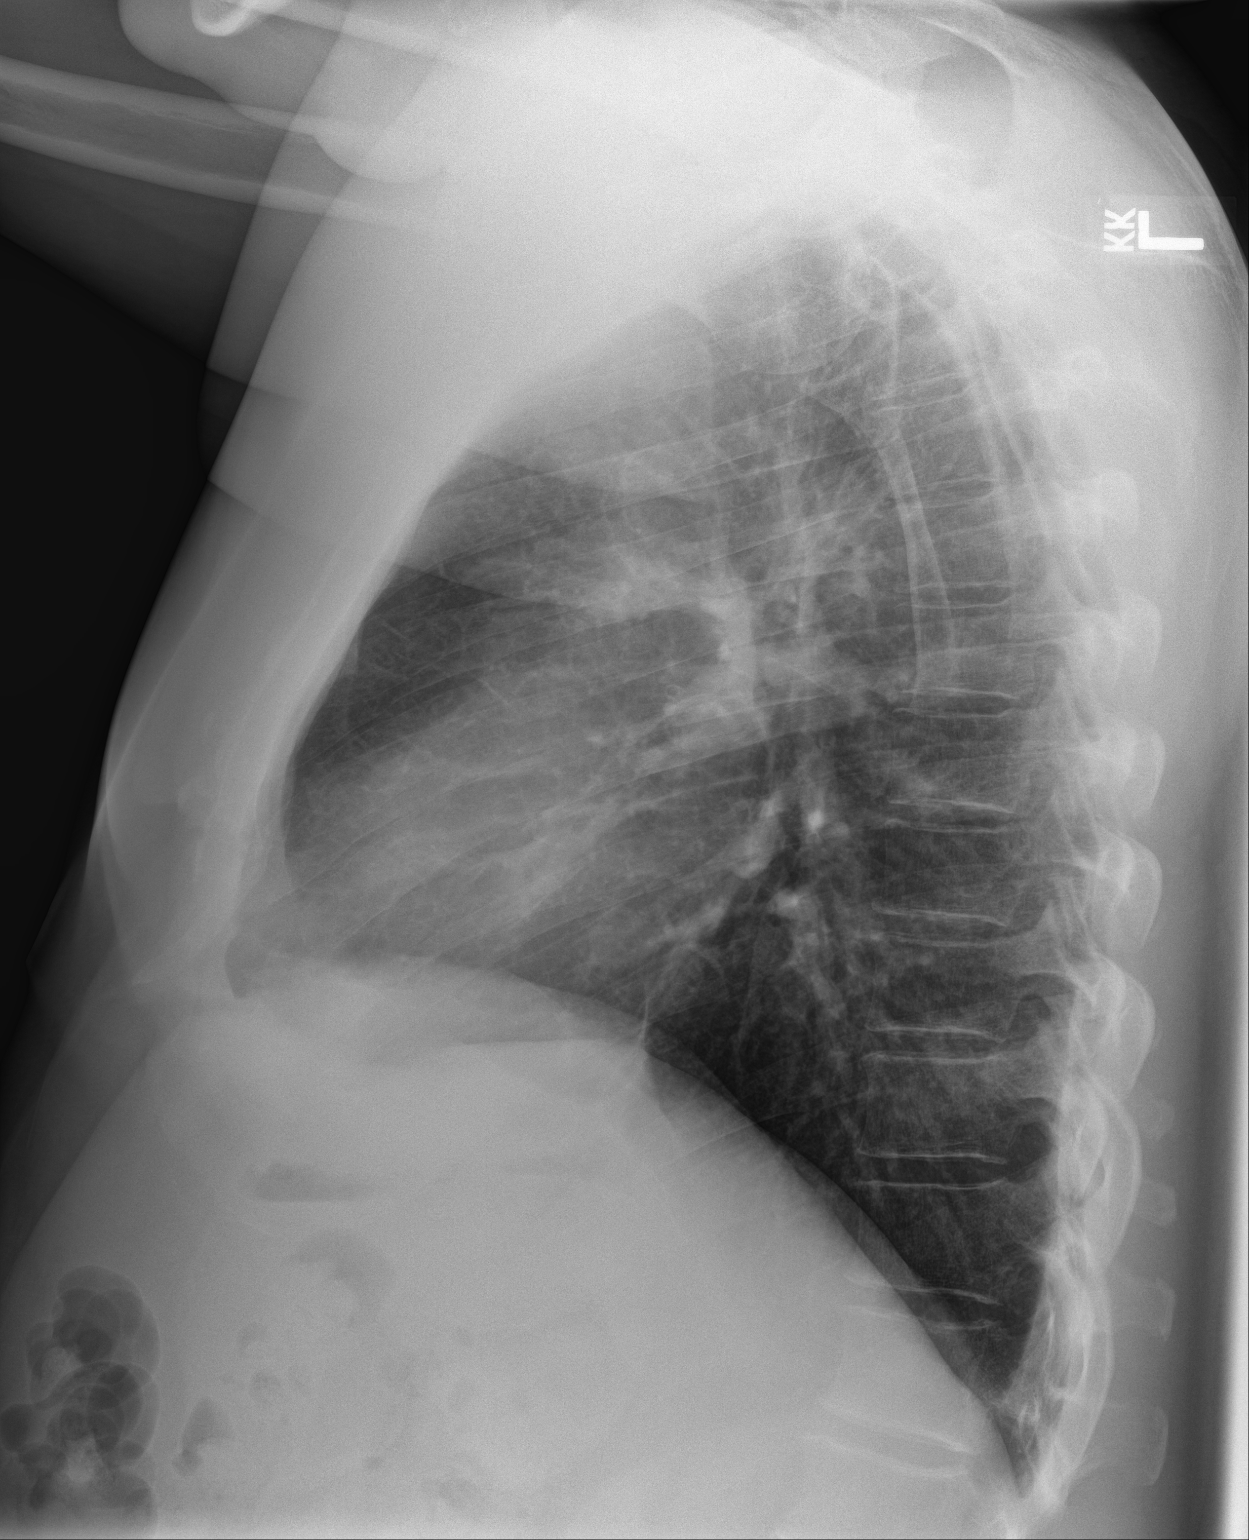

[2 of 2 positions shown; findings below may reference images not displayed]

FINDINGS: There are scattered airspace opacities bilaterally for example in
the right mid lung zone and in the left retrocardiac region. There
is no pneumothorax. No large pleural effusion. The heart size is
normal. There is no acute osseous abnormality.
IMPRESSION: Scattered bilateral airspace opacities which may represent areas of
parenchymal scarring status post WH83Q-WO pneumonia. Alternatively,
these may represent active infiltrates in the appropriate clinical
setting.

## 2021-09-30 ENCOUNTER — Other Ambulatory Visit: Payer: Self-pay

## 2021-09-30 MED ORDER — NEOMYCIN-POLYMYXIN-DEXAMETH 3.5-10000-0.1 OP OINT
TOPICAL_OINTMENT | OPHTHALMIC | 0 refills | Status: AC
  Filled 2021-09-30: qty 3.5, 7d supply, fill #0

## 2023-09-12 ENCOUNTER — Ambulatory Visit
Admission: RE | Admit: 2023-09-12 | Discharge: 2023-09-12 | Disposition: A | Discharge status: Home / Self Care | Source: Ambulatory Visit | Attending: Orthopedic Surgery

## 2023-09-12 ENCOUNTER — Other Ambulatory Visit: Payer: Self-pay | Admitting: Orthopedic Surgery

## 2023-09-12 ENCOUNTER — Ambulatory Visit
Admission: RE | Admit: 2023-09-12 | Discharge: 2023-09-12 | Disposition: A | Discharge status: Home / Self Care | Attending: Orthopedic Surgery | Admitting: Orthopedic Surgery

## 2023-09-12 ENCOUNTER — Ambulatory Visit (INDEPENDENT_AMBULATORY_CARE_PROVIDER_SITE_OTHER): Admitting: Orthopedic Surgery

## 2023-09-12 DIAGNOSIS — M79671 Pain in right foot: Secondary | ICD-10-CM | POA: Diagnosis present

## 2023-09-12 DIAGNOSIS — M7661 Achilles tendinitis, right leg: Secondary | ICD-10-CM | POA: Diagnosis not present

## 2023-09-13 ENCOUNTER — Encounter: Payer: Self-pay | Admitting: Orthopedic Surgery

## 2023-09-13 NOTE — Progress Notes (Signed)
 New Patient Visit  Assessment: Joshua Brock is a 53 y.o. male with the following: 1. Insertional tendinopathy of right Achilles tendon  Plan: Joshua Brock has pain at the insertion of the Achilles.  No bruising is present.  Radiographs show calcaneal heel spurs.  Tendon is intact.  He can plantar flex but it is painful.  Prior history of tendon pain.  He is not interested in a walking boot.  I have recommended medications as needed. Consider a heel cup or heel lift.  Topical treatments.  Exercises provided.   He will contact the clinic if he continues to have issues.   Follow-up: Return if symptoms worsen or fail to improve.  Subjective:  Chief Complaint  Patient presents with   Foot Injury    Saturday he injured his foot climbing on the boat  no pain for 2 days  constant pain      History of Present Illness: Joshua Brock is a 53 y.o. male who presents for evaluation of right foot pain.  [place].  He stepped and caught himself as he was losing his balance.  He did not have immediate pain.  Since then it is progressively worsened.  He has tried taking some medications.  He is having difficulty walking.  No prior injury to his Achilles, although he has had some pain in the heel before.    Review of Systems: No fevers or chills No numbness or tingling No chest pain No shortness of breath No bowel or bladder dysfunction No GI distress No headaches   Medical History:  Past Medical History:  Diagnosis Date   Rupture of right distal biceps tendon 08/28/2013   Wears glasses     Past Surgical History:  Procedure Laterality Date   APPENDECTOMY     DISTAL BICEPS TENDON REPAIR Right 08/28/2013   Procedure: RIGHT ELBOW DISTAL BICEPS TENDON REPAIR;  Surgeon: Fonda SHAUNNA Olmsted, MD;  Location: [facility];  Service: Orthopedics;  Laterality: Right;   KNEE ARTHROSCOPY W/ MENISCECTOMY  1991   right      Family History  Problem Relation Age of Onset   Healthy Mother    Healthy Father    Social History   Tobacco Use   Smoking status: Former    Current packs/day: 0.00    Types: Cigarettes    Quit date: 08/26/1996    Years since quitting: 27.0   Smokeless tobacco: Never  Vaping Use   Vaping status: Never Used  Substance Use Topics   Alcohol use: Yes    Comment: rare   Drug use: No    No Known Allergies  Current Meds  Medication Sig   acetaminophen  (TYLENOL ) 325 MG tablet Take 650 mg by mouth every 6 (six) hours as needed.   ibuprofen  (ADVIL ) 200 MG tablet Take 200 mg by mouth every 6 (six) hours as needed.   neomycin -polymyxin b-dexamethasone  (MAXITROL ) 3.5-10000-0.1 OINT Apply 1 a small amount on eyelid three times a day    Objective: There were no vitals taken for this visit.  Physical Exam:  General: Alert and oriented. and No acute distress. Gait: Right sided antalgic gait.  Right foot without swelling.  No bruising.  Tenderness at the insertion of the Achilles.  Tendon is intact.  He can plantar flex the ankle.  Sensation intact.  Toes are warm and well perfused.   IMAGING: I personally ordered and reviewed the following  images  Right foot XR without injury.  Chronic plantar and insertional bone spur.   New Medications:  No orders of the defined types were placed in this encounter.     Oneil DELENA Horde, MD  09/13/2023 11:13 PM

## 2023-09-15 ENCOUNTER — Encounter: Payer: Self-pay | Admitting: Orthopedic Surgery

## 2024-01-14 ENCOUNTER — Encounter: Payer: Self-pay | Admitting: Radiology
